# Patient Record
Sex: Male | Born: 2000 | Race: White | Hispanic: No | Marital: Single | Smoking: Never smoker
Health system: Southern US, Community
[De-identification: ages and names within clinical notes are randomized; demographics above are authoritative.]

## PROBLEM LIST (undated history)

## (undated) HISTORY — PX: TYMPANOSTOMY TUBE PLACEMENT: SHX32

---
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
## 2022-02-02 ENCOUNTER — Ambulatory Visit: Payer: Self-pay | Admitting: Physician Assistant

## 2022-02-02 ENCOUNTER — Encounter: Payer: Self-pay | Admitting: Physician Assistant

## 2022-02-02 VITALS — BP 137/71 | HR 77 | Temp 97.7°F | Resp 16 | Ht 70.0 in | Wt 205.0 lb

## 2022-02-02 DIAGNOSIS — S93402S Sprain of unspecified ligament of left ankle, sequela: Secondary | ICD-10-CM

## 2022-02-02 NOTE — Progress Notes (Signed)
Denies c/o pain after WC injury to left ankle 01/16/22.  He was seen by [facility] on that date and worked in a boot and sit as needed.  Seen at [facility] on 01/30/22 and released from restrictive boot and here to follow up today as he works with [employer].

## 2022-02-02 NOTE — Progress Notes (Signed)
   Subjective: Follow-up left ankle sprain    Patient ID: Gary Reeves, male    DOB: 20-Nov-2000, 21 y.o.   MRN: [id number]  HPI Patient had a twisting incident on 01/16/2022 resulting in sprain of the left ankle.  Patient was initially seen and followed by [facility].  Patient was released to full duty on 01/30/2022.   Review of Systems Negative except for chief complaint    Objective:   Physical Exam No acute distress.  Patient ambulates with normal gait. BP is 131/71, pulse 77, respirations 16, temperature 97.7, patient 94% O2 sat on room air.  Patient weighs 205 pounds and BMI is 29.41. Examination of the left ankle shows no obvious deformity, edema, erythema or ecchymosis.  Neurovascular intact with free and equal range of motion.          Assessment & Plan: Resolving left ankle sprain   Patient return back to full duty.  Patient vies return back to clinic if condition worsens.

## 2022-09-12 ENCOUNTER — Other Ambulatory Visit: Payer: Self-pay | Admitting: Pediatric Gastroenterology

## 2022-09-12 DIAGNOSIS — K50919 Crohn's disease, unspecified, with unspecified complications: Secondary | ICD-10-CM

## 2022-09-20 ENCOUNTER — Other Ambulatory Visit: Payer: Self-pay | Admitting: Pediatric Gastroenterology

## 2022-09-20 DIAGNOSIS — K50919 Crohn's disease, unspecified, with unspecified complications: Secondary | ICD-10-CM

## 2022-09-25 ENCOUNTER — Ambulatory Visit: Payer: 59

## 2022-09-25 ENCOUNTER — Ambulatory Visit: Admission: RE | Admit: 2022-09-25 | Payer: 59 | Source: Ambulatory Visit

## 2022-10-02 ENCOUNTER — Ambulatory Visit: Payer: 59

## 2022-10-10 ENCOUNTER — Ambulatory Visit: Payer: 59
